# Patient Record
Sex: Male | Born: 1945 | Smoking: Current every day smoker
Health system: Southern US, Community
[De-identification: ages and names within clinical notes are randomized; demographics above are authoritative.]

## PROBLEM LIST (undated history)

## (undated) DIAGNOSIS — I639 Cerebral infarction, unspecified: Secondary | ICD-10-CM

## (undated) DIAGNOSIS — I2510 Atherosclerotic heart disease of native coronary artery without angina pectoris: Secondary | ICD-10-CM

## (undated) DIAGNOSIS — I6930 Unspecified sequelae of cerebral infarction: Secondary | ICD-10-CM

## (undated) DIAGNOSIS — I4891 Unspecified atrial fibrillation: Secondary | ICD-10-CM

## (undated) DIAGNOSIS — I219 Acute myocardial infarction, unspecified: Secondary | ICD-10-CM

## (undated) DIAGNOSIS — H269 Unspecified cataract: Secondary | ICD-10-CM

## (undated) DIAGNOSIS — Z720 Tobacco use: Secondary | ICD-10-CM

## (undated) DIAGNOSIS — I10 Essential (primary) hypertension: Secondary | ICD-10-CM

## (undated) DIAGNOSIS — E785 Hyperlipidemia, unspecified: Secondary | ICD-10-CM

## (undated) HISTORY — DX: Unspecified atrial fibrillation: I48.91

## (undated) HISTORY — DX: Unspecified sequelae of cerebral infarction: I69.30

## (undated) HISTORY — DX: Acute myocardial infarction, unspecified: I21.9

## (undated) HISTORY — PX: SHOULDER ARTHROSCOPY: SHX128

## (undated) HISTORY — DX: Atherosclerotic heart disease of native coronary artery without angina pectoris: I25.10

## (undated) HISTORY — DX: Hyperlipidemia, unspecified: E78.5

## (undated) HISTORY — PX: CATARACT EXTRACTION: SUR2

## (undated) HISTORY — DX: Tobacco use: Z72.0

## (undated) HISTORY — DX: Unspecified cataract: H26.9

## (undated) HISTORY — DX: Cerebral infarction, unspecified: I63.9

## (undated) HISTORY — PX: KNEE ARTHROSCOPY: SUR90

## (undated) HISTORY — PX: TONSILLECTOMY: SUR1361

## (undated) HISTORY — PX: HERNIA REPAIR: SHX51

## (undated) HISTORY — DX: Essential (primary) hypertension: I10

## (undated) HISTORY — PX: CORONARY ANGIOPLASTY WITH STENT PLACEMENT: SHX49

## (undated) HISTORY — PX: CAROTID ENDARTERECTOMY: SUR193

## (undated) HISTORY — PX: CORONARY ARTERY BYPASS GRAFT: SHX141

---

## 1995-11-23 DIAGNOSIS — E7800 Pure hypercholesterolemia, unspecified: Secondary | ICD-10-CM | POA: Insufficient documentation

## 2011-12-17 DIAGNOSIS — I2510 Atherosclerotic heart disease of native coronary artery without angina pectoris: Secondary | ICD-10-CM | POA: Insufficient documentation

## 2011-12-17 DIAGNOSIS — I4891 Unspecified atrial fibrillation: Secondary | ICD-10-CM | POA: Insufficient documentation

## 2012-04-28 DIAGNOSIS — I6930 Unspecified sequelae of cerebral infarction: Secondary | ICD-10-CM | POA: Insufficient documentation

## 2012-04-28 DIAGNOSIS — G609 Hereditary and idiopathic neuropathy, unspecified: Secondary | ICD-10-CM | POA: Insufficient documentation

## 2012-04-28 DIAGNOSIS — Z720 Tobacco use: Secondary | ICD-10-CM

## 2012-04-28 DIAGNOSIS — M1990 Unspecified osteoarthritis, unspecified site: Secondary | ICD-10-CM | POA: Insufficient documentation

## 2012-04-28 DIAGNOSIS — I6990 Unspecified sequelae of unspecified cerebrovascular disease: Secondary | ICD-10-CM | POA: Insufficient documentation

## 2012-04-28 HISTORY — DX: Tobacco use: Z72.0

## 2012-10-14 DIAGNOSIS — I10 Essential (primary) hypertension: Secondary | ICD-10-CM | POA: Insufficient documentation

## 2013-09-07 ENCOUNTER — Ambulatory Visit: Payer: Self-pay | Admitting: Physician Assistant

## 2013-09-08 ENCOUNTER — Ambulatory Visit: Payer: Self-pay | Admitting: Physician Assistant

## 2013-10-12 DIAGNOSIS — M25579 Pain in unspecified ankle and joints of unspecified foot: Secondary | ICD-10-CM | POA: Insufficient documentation

## 2013-11-22 DIAGNOSIS — I509 Heart failure, unspecified: Secondary | ICD-10-CM | POA: Insufficient documentation

## 2014-07-05 DIAGNOSIS — R29898 Other symptoms and signs involving the musculoskeletal system: Secondary | ICD-10-CM | POA: Insufficient documentation

## 2014-07-05 DIAGNOSIS — G589 Mononeuropathy, unspecified: Secondary | ICD-10-CM | POA: Insufficient documentation

## 2015-03-15 DIAGNOSIS — Z8673 Personal history of transient ischemic attack (TIA), and cerebral infarction without residual deficits: Secondary | ICD-10-CM | POA: Insufficient documentation

## 2015-04-09 ENCOUNTER — Ambulatory Visit: Payer: Medicare HMO | Attending: Pain Medicine | Admitting: Pain Medicine

## 2015-04-09 ENCOUNTER — Other Ambulatory Visit: Payer: Self-pay | Admitting: Pain Medicine

## 2015-04-09 ENCOUNTER — Encounter: Payer: Self-pay | Admitting: Pain Medicine

## 2015-04-09 VITALS — BP 132/77 | HR 64 | Temp 98.5°F | Resp 18 | Ht 72.0 in | Wt 232.0 lb

## 2015-04-09 DIAGNOSIS — I10 Essential (primary) hypertension: Secondary | ICD-10-CM | POA: Diagnosis not present

## 2015-04-09 DIAGNOSIS — Z9181 History of falling: Secondary | ICD-10-CM

## 2015-04-09 DIAGNOSIS — F1190 Opioid use, unspecified, uncomplicated: Secondary | ICD-10-CM | POA: Diagnosis not present

## 2015-04-09 DIAGNOSIS — Z8673 Personal history of transient ischemic attack (TIA), and cerebral infarction without residual deficits: Secondary | ICD-10-CM | POA: Insufficient documentation

## 2015-04-09 DIAGNOSIS — F17210 Nicotine dependence, cigarettes, uncomplicated: Secondary | ICD-10-CM | POA: Diagnosis not present

## 2015-04-09 DIAGNOSIS — Z79899 Other long term (current) drug therapy: Secondary | ICD-10-CM | POA: Diagnosis not present

## 2015-04-09 DIAGNOSIS — I4891 Unspecified atrial fibrillation: Secondary | ICD-10-CM | POA: Insufficient documentation

## 2015-04-09 DIAGNOSIS — M79604 Pain in right leg: Secondary | ICD-10-CM | POA: Insufficient documentation

## 2015-04-09 DIAGNOSIS — G8929 Other chronic pain: Secondary | ICD-10-CM

## 2015-04-09 DIAGNOSIS — I69898 Other sequelae of other cerebrovascular disease: Secondary | ICD-10-CM

## 2015-04-09 DIAGNOSIS — M545 Low back pain, unspecified: Secondary | ICD-10-CM

## 2015-04-09 DIAGNOSIS — M79603 Pain in arm, unspecified: Secondary | ICD-10-CM | POA: Diagnosis present

## 2015-04-09 DIAGNOSIS — M25562 Pain in left knee: Secondary | ICD-10-CM

## 2015-04-09 DIAGNOSIS — M5416 Radiculopathy, lumbar region: Secondary | ICD-10-CM

## 2015-04-09 DIAGNOSIS — F1120 Opioid dependence, uncomplicated: Secondary | ICD-10-CM

## 2015-04-09 DIAGNOSIS — Z79891 Long term (current) use of opiate analgesic: Secondary | ICD-10-CM | POA: Insufficient documentation

## 2015-04-09 DIAGNOSIS — M7501 Adhesive capsulitis of right shoulder: Secondary | ICD-10-CM

## 2015-04-09 DIAGNOSIS — M25569 Pain in unspecified knee: Secondary | ICD-10-CM | POA: Insufficient documentation

## 2015-04-09 DIAGNOSIS — M7500 Adhesive capsulitis of unspecified shoulder: Secondary | ICD-10-CM | POA: Insufficient documentation

## 2015-04-09 DIAGNOSIS — Z7189 Other specified counseling: Secondary | ICD-10-CM

## 2015-04-09 DIAGNOSIS — IMO0002 Reserved for concepts with insufficient information to code with codable children: Secondary | ICD-10-CM

## 2015-04-09 DIAGNOSIS — E7800 Pure hypercholesterolemia, unspecified: Secondary | ICD-10-CM | POA: Insufficient documentation

## 2015-04-09 DIAGNOSIS — M21371 Foot drop, right foot: Secondary | ICD-10-CM

## 2015-04-09 DIAGNOSIS — M79673 Pain in unspecified foot: Secondary | ICD-10-CM | POA: Diagnosis present

## 2015-04-09 DIAGNOSIS — M1710 Unilateral primary osteoarthritis, unspecified knee: Secondary | ICD-10-CM

## 2015-04-09 DIAGNOSIS — M792 Neuralgia and neuritis, unspecified: Secondary | ICD-10-CM

## 2015-04-09 DIAGNOSIS — Z5181 Encounter for therapeutic drug level monitoring: Secondary | ICD-10-CM

## 2015-04-09 DIAGNOSIS — M25561 Pain in right knee: Secondary | ICD-10-CM

## 2015-04-09 DIAGNOSIS — F1990 Other psychoactive substance use, unspecified, uncomplicated: Secondary | ICD-10-CM

## 2015-04-09 DIAGNOSIS — R296 Repeated falls: Secondary | ICD-10-CM

## 2015-04-09 DIAGNOSIS — M179 Osteoarthritis of knee, unspecified: Secondary | ICD-10-CM

## 2015-04-09 DIAGNOSIS — M170 Bilateral primary osteoarthritis of knee: Secondary | ICD-10-CM

## 2015-04-09 DIAGNOSIS — M2550 Pain in unspecified joint: Secondary | ICD-10-CM

## 2015-04-09 DIAGNOSIS — I6930 Unspecified sequelae of cerebral infarction: Secondary | ICD-10-CM

## 2015-04-09 HISTORY — DX: Unspecified sequelae of cerebral infarction: I69.30

## 2015-04-09 MED ORDER — OXYCODONE HCL 5 MG PO TABS: 5.0000 mg | ORAL_TABLET | Freq: Every day | ORAL | Status: DC | PRN

## 2015-04-09 MED ORDER — OXYCODONE HCL 5 MG PO CAPS: 5.0000 mg | ORAL_CAPSULE | Freq: Every day | ORAL | Status: DC | PRN

## 2015-04-09 NOTE — Progress Notes (Signed)
Patient's Name: Terrence Miller MRN: [id number] DOB: 10/12/1945 DOS: 04/09/2015  Primary Reason(s) for Visit: Encounter for Medication Management CC: Foot Pain and Arm Pain   HPI:   Terrence Miller is a 69 y.o. year old, male patient, who returns today as an established patient. He has Chronic pain; Long term current use of opiate analgesic; Long term prescription opiate use; Opiate use; Encounter for therapeutic drug level monitoring; Encounter for chronic pain management; Opioid dependence (HCC); Weakness of foot; Current tobacco use; Ankle pain; Arthritis, degenerative; Mononeuropathy; Cerebrovascular accident, late effects; BP (high blood pressure); Hypercholesterolemia; Cerebrovascular accident, old; Hereditary and idiopathic neuropathy; Arteriosclerosis of coronary artery; Congestive heart failure (HCC); Atrial fibrillation (HCC); Chronic low back pain; Chronic lower extremity pain (Right); Chronic lumbar radicular pain (Right); Osteoarthritis of the knees (Bilateral); Chronic knee pain (Bilateral) (L>R); Foot drop (Right-sided) ; Frozen shoulder (Right side); Neurological deficit due to ischemic stroke (Right-sided body weakness) (last one in 2015); Neuropathic pain; Neurogenic pain; Arthritic-like pain; Substance use disorder Risk: High; At high risk for falls; Personal history of stroke with residual effects (multiple strokes) (Right-sided body weakness); and Tricompartmental disease of knee (Bilateral) on his problem list.. His primarily concern today is the Foot Pain and Arm Pain     Two (2) weeks ago the patient had another stroke with right-sided weakness. He is currently undergoing physical therapy 2 see if he can recall over some of the strength in his hand and right leg. He complains of a burning sensation over the top of the foot in what seems to be an L5 dermatomal distribution. He is currently taking gabapentin and he is slowly being increased. I agree with this choice of medication which I  believe is the best for this neuropathic pain. He comes in today in a wheelchair.  Today's Pain Score: 6 , clinically he looks like a 2/10. Reported level of pain is incompatible with clinical obrservations. This may be secondary to a possible lack of understanding on how the pain scale works. Pain Type: Chronic pain Pain Location: Foot Pain Orientation: Right Pain Descriptors / Indicators: Constant, Stabbing, Aching Pain Frequency: Constant    Last visit: 01/02/2015   Reason for last evaluation: Pharmacological management.  Pharmacotherapy Review:   Side-effects or Adverse reactions: None reported. Effectiveness: Described as relatively effective, allowing for increase in activities of daily living (ADL). Onset of action: Within expected pharmacological parameters. Duration of action: Within normal limits for medication. Peak effect: Timing and results are as within normal expected parameters. [place] PMP: Compliant with practice rules and regulations. Medication Assessment Form: Reviewed. Patient indicates being compliant with therapy Treatment compliance: Compliant. Substance Use Disorder (SUD) Risk Level: High Pharmacologic Plan: Continue therapy as is. Although the patient is high risk, we have decided to give him medication refills for the next 3 months in order for him to concentrate on his physical therapy. After that, and depending on the results of his UDS, we will decide if we need to go back to shorter intervals between visits.  Allergies: Terrence Miller is allergic to amitriptyline.  Meds: The patient has a current medication list which includes the following prescription(s): atorvastatin, clopidogrel, gabapentin, losartan, metoprolol succinate, oxycodone, spironolactone, oxycodone, and oxycodone. Requested Prescriptions   Signed Prescriptions Disp Refills  . oxyCODONE (OXY IR/ROXICODONE) 5 MG immediate release tablet 150 tablet 0    Sig: Take 1 tablet (5 mg total) by mouth 5  (five) times daily as needed.  Marland Kitchen oxycodone (OXY-IR) 5 MG capsule 150 capsule 0  Sig: Take 1 capsule (5 mg total) by mouth 5 (five) times daily as needed for pain.  . oxycodone (OXY-IR) 5Marland Kitchen MG capsule 150 capsule 0    Sig: Take 1 capsule (5 mg total) by mouth 5 (five) times daily as needed for pain.    ROS: Constitutional: Afebrile, no chills, well hydrated and well nourished Gastrointestinal: negative Musculoskeletal:negative Neurological: negative Behavioral/Psych: negative  PFSH: Medical:  Terrence Miller  has a past medical history of Stroke [facility]); Atrial fibrillation (HCC); Cataract; Hypertension; Hyperlipidemia; CAD (coronary artery disease); Myocardial infarction (HCC); Current tobacco use (04/28/2012); and Personal history of stroke with residual effects (multiple strokes) (04/09/2015). Family: family history includes Heart disease in his father and mother. Surgical:  has past surgical history that includes Hernia repair; Cataract extraction (Bilateral); Coronary artery bypass graft; Shoulder arthroscopy (Right); Knee arthroscopy (Bilateral); Carotid endarterectomy (Left); Tonsillectomy; and Coronary angioplasty with stent. Tobacco:  reports that he has been smoking Cigarettes.  He has been smoking about 0.25 packs per day. He has never used smokeless tobacco. Alcohol:  reports that he does not drink alcohol. Drug:  reports that he does not use illicit drugs.  Physical Exam: Vitals:  Today's Vitals   04/09/15 1019  BP: 132/77  Pulse: 64  Temp: 98.5 F (36.9 C)  TempSrc: Oral  Resp: 18  Height: 6' (1.829 m)  Weight: 232 lb (105.235 kg)  SpO2: 98%  PainSc: 6   PainLoc: Foot  Calculated BMI: Body mass index is 31.46 kg/(m^2). General appearance: alert, cooperative, appears stated age, no distress and mildly obese Eyes: conjunctivae/corneas clear. PERRL, EOM's intact. Fundi benign. Lungs: No evidence respiratory distress, no audible rales or ronchi and no use of accessory  muscles of respiration Neck: no adenopathy, no carotid bruit, no JVD, supple, symmetrical, trachea midline and thyroid not enlarged, symmetric, no tenderness/mass/nodules Back: symmetric, no curvature. ROM normal. No CVA tenderness. Extremities: He comes into the clinic today in a wheelchair due to his most recent stroke and right-sided weakness. Pulses: 2+ and symmetric Skin: Skin color, texture, turgor normal. No rashes or lesions Neurologic: Right-sided body weakness secondary to the stroke. This includes a right foot drop.    Assessment: Encounter Diagnosis:  Primary Diagnosis: Chronic pain [G89.29]  Plan: Ruvim was seen today for foot pain and arm pain.  Diagnoses and all orders for this visit:  Chronic pain -     COMPLETE METABOLIC PANEL WITH GFR; Future -     C-reactive protein; Future -     Magnesium; Future -     Sedimentation rate; Future -     Vitamin D2,D3 Panel; Future -     oxyCODONE (OXY IR/ROXICODONE) 5 MG immediate release tablet; Take 1 tablet (5 mg total) by mouth 5 (five) times daily as needed. -     oxycodone (OXY-IR) 5 MG capsule; Take 1 capsule (5 mg total) by mouth 5 (five) times daily as needed for pain. -     oxycodone (OXY-IR) 5 MG capsule; Take 1 capsule (5 mg total) by mouth 5 (five) times daily as needed for pain.  Long term current use of opiate analgesic -     Drugs of abuse screen w/o alc, rtn urine-sln; Future -     Drugs of abuse screen w/o alc, rtn urine-sln  Long term prescription opiate use  Opiate use  Encounter for therapeutic drug level monitoring  Encounter for chronic pain management  Uncomplicated opioid dependence (HCC)  Chronic low back pain  Chronic right lower extremity pain  Chronic lumbar radicular pain (Right)  Primary osteoarthritis of both knees  Chronic knee pain (Bilateral)  Foot drop (Right-sided)   Frozen shoulder, right  Neurological deficit due to ischemic stroke (Right-sided body weakness) (last one in  2015)  Neuropathic pain  Neurogenic pain  Arthritic-like pain  Substance use disorder Risk: High  At high risk for falls  Personal history of stroke with residual effects (multiple strokes) (Right-sided body weakness)  Tricompartmental disease of knee (Bilateral)     Patient Instructions  Labwork to be drawn as discussed.   Medications discontinued today:  Medications Discontinued During This Encounter  Medication Reason  . oxyCODONE (OXY IR/ROXICODONE) 5 MG immediate release tablet Reorder   Medications administered today:  [name] had no medications administered during this visit.  Primary Care Physician: No primary care provider on file. Location: [facility] Note by: Valyncia Wiens A. Laban EmperorNaveira, M.D, DABA, DABAPM, DABPM, DABIPP, FIPP

## 2015-04-09 NOTE — Progress Notes (Signed)
Safety precautions to be maintained throughout the outpatient stay will include: orient to surroundings, keep bed in low position, maintain call bell within reach at all times, provide assistance with transfer out of bed and ambulation.  Oxycodone count 0 

## 2015-04-09 NOTE — Patient Instructions (Signed)
Labwork to be drawn as discussed 

## 2015-04-14 LAB — TOXASSURE SELECT 13 (MW), URINE: PDF: 0

## 2015-05-01 DIAGNOSIS — I63541 Cerebral infarction due to unspecified occlusion or stenosis of right cerebellar artery: Secondary | ICD-10-CM | POA: Insufficient documentation

## 2015-05-15 DIAGNOSIS — G8191 Hemiplegia, unspecified affecting right dominant side: Secondary | ICD-10-CM | POA: Insufficient documentation

## 2015-05-23 DIAGNOSIS — I69359 Hemiplegia and hemiparesis following cerebral infarction affecting unspecified side: Secondary | ICD-10-CM | POA: Insufficient documentation

## 2015-07-09 ENCOUNTER — Encounter: Payer: Self-pay | Admitting: Pain Medicine

## 2015-07-09 ENCOUNTER — Ambulatory Visit: Payer: Medicare HMO | Attending: Pain Medicine | Admitting: Pain Medicine

## 2015-07-09 VITALS — BP 153/78 | HR 67 | Temp 97.9°F | Resp 18 | Ht 72.0 in | Wt 239.0 lb

## 2015-07-09 DIAGNOSIS — I4891 Unspecified atrial fibrillation: Secondary | ICD-10-CM | POA: Diagnosis not present

## 2015-07-09 DIAGNOSIS — M818 Other osteoporosis without current pathological fracture: Secondary | ICD-10-CM

## 2015-07-09 DIAGNOSIS — Z9889 Other specified postprocedural states: Secondary | ICD-10-CM | POA: Insufficient documentation

## 2015-07-09 DIAGNOSIS — M79603 Pain in arm, unspecified: Secondary | ICD-10-CM | POA: Diagnosis present

## 2015-07-09 DIAGNOSIS — G8929 Other chronic pain: Secondary | ICD-10-CM | POA: Diagnosis not present

## 2015-07-09 DIAGNOSIS — Z951 Presence of aortocoronary bypass graft: Secondary | ICD-10-CM | POA: Insufficient documentation

## 2015-07-09 DIAGNOSIS — M25561 Pain in right knee: Secondary | ICD-10-CM | POA: Insufficient documentation

## 2015-07-09 DIAGNOSIS — E785 Hyperlipidemia, unspecified: Secondary | ICD-10-CM | POA: Insufficient documentation

## 2015-07-09 DIAGNOSIS — E7800 Pure hypercholesterolemia, unspecified: Secondary | ICD-10-CM | POA: Diagnosis not present

## 2015-07-09 DIAGNOSIS — Z5181 Encounter for therapeutic drug level monitoring: Secondary | ICD-10-CM | POA: Diagnosis not present

## 2015-07-09 DIAGNOSIS — I252 Old myocardial infarction: Secondary | ICD-10-CM | POA: Insufficient documentation

## 2015-07-09 DIAGNOSIS — I69359 Hemiplegia and hemiparesis following cerebral infarction affecting unspecified side: Secondary | ICD-10-CM | POA: Diagnosis not present

## 2015-07-09 DIAGNOSIS — Z79891 Long term (current) use of opiate analgesic: Secondary | ICD-10-CM | POA: Insufficient documentation

## 2015-07-09 DIAGNOSIS — F1190 Opioid use, unspecified, uncomplicated: Secondary | ICD-10-CM

## 2015-07-09 DIAGNOSIS — F17210 Nicotine dependence, cigarettes, uncomplicated: Secondary | ICD-10-CM | POA: Insufficient documentation

## 2015-07-09 DIAGNOSIS — M810 Age-related osteoporosis without current pathological fracture: Secondary | ICD-10-CM | POA: Diagnosis not present

## 2015-07-09 DIAGNOSIS — M7120 Synovial cyst of popliteal space [Baker], unspecified knee: Secondary | ICD-10-CM | POA: Insufficient documentation

## 2015-07-09 DIAGNOSIS — I509 Heart failure, unspecified: Secondary | ICD-10-CM | POA: Insufficient documentation

## 2015-07-09 DIAGNOSIS — M792 Neuralgia and neuritis, unspecified: Secondary | ICD-10-CM

## 2015-07-09 DIAGNOSIS — M25562 Pain in left knee: Secondary | ICD-10-CM

## 2015-07-09 DIAGNOSIS — I10 Essential (primary) hypertension: Secondary | ICD-10-CM | POA: Insufficient documentation

## 2015-07-09 DIAGNOSIS — M7121 Synovial cyst of popliteal space [Baker], right knee: Secondary | ICD-10-CM | POA: Diagnosis not present

## 2015-07-09 DIAGNOSIS — M79606 Pain in leg, unspecified: Secondary | ICD-10-CM | POA: Diagnosis present

## 2015-07-09 LAB — C-REACTIVE PROTEIN

## 2015-07-09 LAB — SEDIMENTATION RATE: Sed Rate: 13 mm/hr (ref 0–20)

## 2015-07-09 LAB — COMPREHENSIVE METABOLIC PANEL
ALT: 13 U/L — ABNORMAL LOW (ref 17–63)
ANION GAP: 8 (ref 5–15)
AST: 20 U/L (ref 15–41)
Albumin: 4.3 g/dL (ref 3.5–5.0)
Alkaline Phosphatase: 101 U/L (ref 38–126)
BILIRUBIN TOTAL: 0.8 mg/dL (ref 0.3–1.2)
BUN: 19 mg/dL (ref 6–20)
CHLORIDE: 108 mmol/L (ref 101–111)
CO2: 25 mmol/L (ref 22–32)
Calcium: 9.7 mg/dL (ref 8.9–10.3)
Creatinine, Ser: 1.17 mg/dL (ref 0.61–1.24)
Glucose, Bld: 106 mg/dL — ABNORMAL HIGH (ref 65–99)
POTASSIUM: 5.1 mmol/L (ref 3.5–5.1)
Sodium: 141 mmol/L (ref 135–145)
TOTAL PROTEIN: 8 g/dL (ref 6.5–8.1)

## 2015-07-09 LAB — MAGNESIUM: MAGNESIUM: 2.1 mg/dL (ref 1.7–2.4)

## 2015-07-09 MED ORDER — OXYCODONE HCL 5 MG PO TABS: 5.0000 mg | ORAL_TABLET | Freq: Every day | ORAL | Status: DC | PRN

## 2015-07-09 MED ORDER — GABAPENTIN 800 MG PO TABS: 1200.0000 mg | ORAL_TABLET | Freq: Four times a day (QID) | ORAL | Status: DC

## 2015-07-09 NOTE — Progress Notes (Signed)
Patient's Name: Terrence Miller MRN: [id number] DOB: 02-Mar-1946 DOS: 07/09/2015  Primary Reason(s) for Visit: Encounter for Medication Management CC: Leg Pain and Arm Pain   HPI  Terrence Miller is a 70 y.o. year old, male patient, who returns today as an established patient. He has Chronic pain; Long term current use of opiate analgesic; Long term prescription opiate use; Opiate use (37.5 MME/day); Encounter for therapeutic drug level monitoring; Encounter for chronic pain management; Opioid dependence (HCC); Weakness of foot; Current tobacco use; Ankle pain; Arthritis, degenerative; Mononeuropathy; Cerebrovascular accident, late effects; BP (high blood pressure); Hypercholesterolemia; Cerebrovascular accident, old; Hereditary and idiopathic neuropathy; Arteriosclerosis of coronary artery; Congestive heart failure (HCC); Atrial fibrillation (HCC); Chronic low back pain; Chronic lower extremity pain (Right); Chronic lumbar radicular pain (Right); Osteoarthritis of the knees (Bilateral); Chronic knee pain (Bilateral) (L>R); Foot drop (Right-sided) ; Frozen shoulder (Right side); Neurological deficit due to ischemic stroke (Right-sided body weakness) (last one in 2015); Neuropathic pain; Neurogenic pain; Arthritic-like pain; Substance use disorder Risk: High; At high risk for falls; Personal history of stroke with residual effects (multiple strokes) (Right-sided body weakness); Tricompartmental disease of knee (Bilateral); Osteoporosis, idiopathic; Hemiparesis, right (HCC); 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 His primarily concern today is the Leg Pain and Arm Pain   The patient comes in today clinics today for pharmacological management of his chronic pain. He apparently had another stroke with right arm weakness and right lower extremity  weakness and pain. This is his seventh stroke. Today he is requesting for me to go back to the OxyContin. However, last time we switch from the OxyContin to the oxycodone because the insurance co-pay was not paying for it. He later requested that we than increase his oxycodone to 10 mg. However, in reviewing how he is doing with his current dosing where he is getting 70 09/29/1978 percent relief with the medication, I see no point in increase in it. Today I talked to him about medication tolerance and how to deal with it using "Drug Holidays". I also reviewed with him the goals of chronic pain management which are functionality rather than 100% relief of the pain. He understood and agreed. In addition, he indicates that his primary pain is that of the right knee which is secondary to a Baker's cyst. He has been getting intra-articular knee injections with viscosupplementation therapy. There is injections have been done by Dr. Margaretmary Bayley in [place] (orthopedic surgeon).  His second source of pain is the right arm which started hurting after he had his seventh stroke. This pain is neuropathic and therefore it would respond poorly to the use of opioids. To assist him with this kind of pain I will increase his gabapentin to 800 mg 4 times a day.  Pain Assessment: Self-Reported Pain Score: 7 , clinically he looks like a 2-3/10. Reported level is inconsistent with clinical obrservations Pain Type: Chronic pain Pain Location: Leg (arm) Pain Orientation: Right Pain Descriptors / Indicators: Aching Pain Frequency: Constant  Date of Last Visit: 04/09/15 Service Provided on Last Visit: Med Refill  Controlled Substance Pharmacotherapy Assessment  Analgesic: Oxycodone IR 5 mg 5 times a day (25 mg/day) MME/day: 37.5 mg/day Pharmacokinetics: Onset of action (Liberation/Absorption): Within expected pharmacological parameters. (One hour) Time to Peak effect (Distribution): Timing and results are as within normal  expected parameters. (2.5-3 hours) Duration of action (Metabolism/Excretion): Within  normal limits for medication. (5-6 hours) Pharmacodynamics: Analgesic Effect: 75-80% Activity Facilitation: Medication(s) allow patient to sit, stand, walk, and do the basic ADLs Perceived Effectiveness: Described as relatively effective, allowing for increase in activities of daily living (ADL) Side-effects or Adverse reactions: None reported Monitoring: [place] PMP: Compliant with practice rules and regulations UDS Results/interpretation: Last UDS done on 04/09/2015 and it came back within normal limits with no unexpected results. Medication Assessment Form: Reviewed. Patient indicates being compliant with therapy Treatment compliance: Compliant Risk Assessment: Substance Use Disorder (SUD) Risk Level: Low Opioid Risk Tool (ORT) Score: Total Score: 6 Moderate Risk for SUD (Score between 4-7) Depression Scale Score:    Pharmacologic Plan: Continue therapy as is  Lab Work: Illicit Drugs No results found for: THCU, COCAINSCRNUR, PCPSCRNUR, MDMA, AMPHETMU, METHADONE, ETOH  Inflammation Markers Lab Results  Component Value Date   ESRSEDRATE 13 07/09/2015    Renal Function Lab Results  Component Value Date   BUN 19 07/09/2015   CREATININE 1.17 07/09/2015   GFRAA >60 07/09/2015   GFRNONAA >60 07/09/2015    Hepatic Function Lab Results  Component Value Date   AST 20 07/09/2015   ALT 13* 07/09/2015   ALBUMIN 4.3 07/09/2015    Electrolytes Lab Results  Component Value Date   NA 141 07/09/2015   K 5.1 07/09/2015   CL 108 07/09/2015   CALCIUM 9.7 07/09/2015   MG 2.1 07/09/2015    Allergies  Terrence Miller is allergic to amitriptyline.  Meds  The patient has a current medication list which includes the following prescription(s): allopurinol, aspirin, atorvastatin, clopidogrel, furosemide, gabapentin, losartan, metoprolol succinate, oxycodone, spironolactone, oxycodone, and  oxycodone.  Current Outpatient Prescriptions on File Prior to Visit  Medication Sig  . atorvastatin (LIPITOR) 80 MG tablet Take 80 mg by mouth daily.  . clopidogrel (PLAVIX) 75 MG tablet Take 75 mg by mouth daily.  Marland Kitchen losartan (COZAAR) 100 MG tablet Take 100 mg by mouth daily.  . metoprolol succinate (TOPROL-XL) 25 MG 24 hr tablet Take 25 mg by mouth daily. Take 1/2 tablet  . spironolactone (ALDACTONE) 25 MG tablet Take 25 mg by mouth daily.   No current facility-administered medications on file prior to visit.    ROS  Constitutional: Afebrile, no chills, well hydrated and well nourished Gastrointestinal: negative Musculoskeletal:negative Neurological: negative Behavioral/Psych: negative  PFSH  Medical:  Terrence Miller  has a past medical history of Stroke [facility]); Atrial fibrillation (HCC); Cataract; Hypertension; Hyperlipidemia; CAD (coronary artery disease); Myocardial infarction (HCC); Current tobacco use (04/28/2012); and Personal history of stroke with residual effects (multiple strokes) (04/09/2015). Family: family history includes Heart disease in his father and mother. Surgical:  has past surgical history that includes Hernia repair; Cataract extraction (Bilateral); Coronary artery bypass graft; Shoulder arthroscopy (Right); Knee arthroscopy (Bilateral); Carotid endarterectomy (Left); Tonsillectomy; and Coronary angioplasty with stent. Tobacco:  reports that he has been smoking Cigarettes.  He has been smoking about 0.25 packs per day. He has never used smokeless tobacco. Alcohol:  reports that he does not drink alcohol. Drug:  reports that he does not use illicit drugs.  Physical Exam  Vitals:  Today's Vitals   07/09/15 1029 07/09/15 1031  BP:  153/78  Pulse: 67   Temp: 97.9 F (36.6 C)   Resp: 18   Height: 6' (1.829 m)   Weight: 239 lb (108.41 kg)   SpO2: 98%   PainSc: 7  7   PainLoc: Leg     Calculated BMI: Body mass index is 32.41  kg/(m^2).  General appearance:  alert, cooperative, appears stated age and no distress Eyes: PERLA Respiratory: No evidence respiratory distress, no audible rales or ronchi and no use of accessory muscles of respiration  Cervical Spine Inspection: Normal anatomy Alignment: Symetrical ROM: Adequate  Upper Extremities Inspection: No gross anomalies detected ROM: Decreased for right upper extremity Sensory: Normal Motor: Weakness on the right upper extremity  Thoracic Spine Inspection: No gross anomalies detected Alignment: Symetrical ROM: Adequate  Lumbar Spine Inspection: No gross anomalies detected Alignment: Symetrical ROM: Decreased  Gait: Antalgic (limping)  Lower Extremities Inspection: No gross anomalies detected ROM: Decreased for right knee where he has a brace. Sensory:  Normal Motor: Unremarkable  Assessment & Plan  Primary Diagnosis & Pertinent Problem List: The primary encounter diagnosis was Chronic pain. Diagnoses of Chronic knee pain (Bilateral) (L>R), Encounter for therapeutic drug level monitoring, Long term current use of opiate analgesic, Osteoporosis, idiopathic, Neurogenic pain, Neuropathic pain, Opiate use (37.5 MME/day), and Baker's cyst of knee, right were also pertinent to this visit.  Visit Diagnosis: 1. Chronic pain   2. Chronic knee pain (Bilateral) (L>R)   3. Encounter for therapeutic drug level monitoring   4. Long term current use of opiate analgesic   5. Osteoporosis, idiopathic   6. Neurogenic pain   7. Neuropathic pain   8. Opiate use (37.5 MME/day)   9. Baker's cyst of knee, right     Problem-specific Plan(s): No problem-specific assessment & plan notes found for this encounter.   Plan of Care  Pharmacotherapy (Medications Ordered): Meds ordered this encounter  Medications  . oxyCODONE (OXY IR/ROXICODONE) 5 MG immediate release tablet    Sig: Take 1 tablet (5 mg total) by mouth 5 (five) times daily as needed for moderate pain or severe pain.    Dispense:   150 tablet    Refill:  0    Do not place this medication, or any other prescription from our practice, on "Automatic Refill". Patient may have prescription filled one day early if pharmacy is closed on scheduled refill date. Do not fill until: 07/09/15 To last until: 08/08/15  . oxyCODONE (OXY IR/ROXICODONE) 5 MG immediate release tablet    Sig: Take 1 tablet (5 mg total) by mouth 5 (five) times daily as needed for moderate pain or severe pain.    Dispense:  150 tablet    Refill:  0    Do not place this medication, or any other prescription from our practice, on "Automatic Refill". Patient may have prescription filled one day early if pharmacy is closed on scheduled refill date. Do not fill until: 08/08/15 To last until: 09/07/15  . oxyCODONE (OXY IR/ROXICODONE) 5 MG immediate release tablet    Sig: Take 1 tablet (5 mg total) by mouth 5 (five) times daily as needed for moderate pain or severe pain.    Dispense:  150 tablet    Refill:  0    Do not place this medication, or any other prescription from our practice, on "Automatic Refill". Patient may have prescription filled one day early if pharmacy is closed on scheduled refill date. Do not fill until: 09/07/15 To last until: 10/07/15  . gabapentin (NEURONTIN) 800 MG tablet    Sig: Take 1.5 tablets (1,200 mg total) by mouth every 6 (six) hours.    Dispense:  120 tablet    Refill:  2    Do not place this medication, or any other prescription from our practice, on "Automatic Refill". Patient may have prescription  filled one day early if pharmacy is closed on scheduled refill date.    Lab-work & Procedure Ordered: Orders Placed This Encounter  Procedures  . ToxASSURE Select 13 (MW), Urine    Volume: 30 ml(s). Minimum 3 ml of urine is needed. Document temperature of fresh sample. Indications: Long term (current) use of opiate analgesic (Z79.891)  . Comprehensive metabolic panel    Order Specific Question:  Has the patient fasted?     Answer:  No  . C-reactive protein  . Magnesium  . Sedimentation rate  . Vitamin B12    Indication: Bone Pain (M89.9)  . Vitamin D pnl(25-hydrxy+1,25-dihy)-bld    Imaging Ordered: None  Interventional Therapies: Scheduled: None at this time. PRN Procedures: None at this time.    Referral(s) or Consult(s): None at this time.  Medications administered during this visit: Terrence Miller had no medications administered during this visit.  Future Appointments Date Time Provider Department Center  10/03/2015 10:00 AM Delano Metz, MD [facility] None    Primary Care Physician: No primary care provider on file. Location: [facility] Note by: Amoria Mclees A. Laban Emperor, M.D, DABA, DABAPM, DABPM, DABIPP, FIPP  Pain Score Disclaimer: We use the NRS-11 scale. This is a self-reported, subjective measurement of pain severity with only modest accuracy. It is used primarily to identify changes within a particular patient. It must be understood that outpatient pain scales are significantly less accurate that those used for research, where they can be applied under ideal controlled circumstances with minimal exposure to variables. In reality, the score is likely to be a combination of pain intensity and pain affect, where pain affect describes the degree of emotional arousal or changes in action readiness caused by the sensory experience of pain. Factors such as social and work situation, setting, emotional state, anxiety levels, expectation, and prior pain experience may influence pain perception and show large inter-individual differences that may also be affected by time variables.

## 2015-07-09 NOTE — Progress Notes (Signed)
Safety precautions to be maintained throughout the outpatient stay will include: orient to surroundings, keep bed in low position, maintain call bell within reach at all times, provide assistance with transfer out of bed and ambulation. Oxycodone pill count # 0/150  Filled 06-08-2015

## 2015-07-09 NOTE — Patient Instructions (Addendum)
Smoking Cessation, Tips for Success If you are ready to quit smoking, congratulations! You have chosen to help yourself be healthier. Cigarettes bring nicotine, tar, carbon monoxide, and other irritants into your body. Your lungs, heart, and blood vessels will be able to work better without these poisons. There are many different ways to quit smoking. Nicotine gum, nicotine patches, a nicotine inhaler, or nicotine nasal spray can help with physical craving. Hypnosis, support groups, and medicines help break the habit of smoking. WHAT THINGS CAN I DO TO MAKE QUITTING EASIER?  Here are some tips to help you quit for good:  Pick a date when you will quit smoking completely. Tell all of your friends and family about your plan to quit on that date.  Do not try to slowly cut down on the number of cigarettes you are smoking. Pick a quit date and quit smoking completely starting on that day.  Throw away all cigarettes.   Clean and remove all ashtrays from your home, work, and car.  On a card, write down your reasons for quitting. Carry the card with you and read it when you get the urge to smoke.  Cleanse your body of nicotine. Drink enough water and fluids to keep your urine clear or pale yellow. Do this after quitting to flush the nicotine from your body.  Learn to predict your moods. Do not let a bad situation be your excuse to have a cigarette. Some situations in your life might tempt you into wanting a cigarette.  Never have "just one" cigarette. It leads to wanting another and another. Remind yourself of your decision to quit.  Change habits associated with smoking. If you smoked while driving or when feeling stressed, try other activities to replace smoking. Stand up when drinking your coffee. Brush your teeth after eating. Sit in a different chair when you read the paper. Avoid alcohol while trying to quit, and try to drink fewer caffeinated beverages. Alcohol and caffeine may urge you to  smoke.  Avoid foods and drinks that can trigger a desire to smoke, such as sugary or spicy foods and alcohol.  Ask people who smoke not to smoke around you.  Have something planned to do right after eating or having a cup of coffee. For example, plan to take a walk or exercise.  Try a relaxation exercise to calm you down and decrease your stress. Remember, you may be tense and nervous for the first 2 weeks after you quit, but this will pass.  Find new activities to keep your hands busy. Play with a pen, coin, or rubber band. Doodle or draw things on paper.  Brush your teeth right after eating. This will help cut down on the craving for the taste of tobacco after meals. You can also try mouthwash.   Use oral substitutes in place of cigarettes. Try using lemon drops, carrots, cinnamon sticks, or chewing gum. Keep them handy so they are available when you have the urge to smoke.  When you have the urge to smoke, try deep breathing.  Designate your home as a nonsmoking area.  If you are a heavy smoker, ask your health care provider about a prescription for nicotine chewing gum. It can ease your withdrawal from nicotine.  Reward yourself. Set aside the cigarette money you save and buy yourself something nice.  Look for support from others. Join a support group or smoking cessation program. Ask someone at home or at work to help you with your plan   to quit smoking.  Always ask yourself, "Do I need this cigarette or is this just a reflex?" Tell yourself, "Today, I choose not to smoke," or "I do not want to smoke." You are reminding yourself of your decision to quit.  Do not replace cigarette smoking with electronic cigarettes (commonly called e-cigarettes). The safety of e-cigarettes is unknown, and some may contain harmful chemicals.  If you relapse, do not give up! Plan ahead and think about what you will do the next time you get the urge to smoke. HOW WILL I FEEL WHEN I QUIT SMOKING? You  may have symptoms of withdrawal because your body is used to nicotine (the addictive substance in cigarettes). You may crave cigarettes, be irritable, feel very hungry, cough often, get headaches, or have difficulty concentrating. The withdrawal symptoms are only temporary. They are strongest when you first quit but will go away within 10-14 days. When withdrawal symptoms occur, stay in control. Think about your reasons for quitting. Remind yourself that these are signs that your body is healing and getting used to being without cigarettes. Remember that withdrawal symptoms are easier to treat than the major diseases that smoking can cause.  Even after the withdrawal is over, expect periodic urges to smoke. However, these cravings are generally short lived and will go away whether you smoke or not. Do not smoke! WHAT RESOURCES ARE AVAILABLE TO HELP ME QUIT SMOKING? Your health care provider can direct you to community resources or hospitals for support, which may include:  Group support.  Education.  Hypnosis.  Therapy.   This information is not intended to replace advice given to you by your health care provider. Make sure you discuss any questions you have with your health care provider.   Document Released: 01/25/2004 Document Revised: 05/19/2014 Document Reviewed: 10/14/2012 Elsevier Interactive Patient Education 2016 [employer]. Instructed to get labwork drawn at pre admit testing today.

## 2015-07-10 NOTE — Progress Notes (Signed)
Quick Note:   Normal fasting (NPO x 8 hours) glucose levels are between 65-99 mg/dl, with 2 hour fasting, levels are usually less than 140 mg/dl. Any random blood glucose level greater than 200 mg/dl is considered to be Diabetes.  While most low ALT level results indicate a normal healthy liver, that may not always be the case. A low-functioning or non-functioning liver, lacking normal levels of ALT activity to begin with, would not release a lot of ALT into the blood when damaged. People infected with the hepatitis C virus initially show high ALT levels in their blood, but these levels fall over time. Because the ALT test measures ALT levels at only one point in time, people with chronic hepatitis C infection may already have experienced the ALT peak well before blood was drawn for the ALT test. Urinary tract infections or malnutrition may also cause low blood ALT levels. ______ 

## 2015-07-10 NOTE — Progress Notes (Signed)
Quick Note:  Lab results reviewed and found to be within normal limits. ______ 

## 2015-07-14 LAB — TOXASSURE SELECT 13 (MW), URINE: PDF: 0

## 2015-07-26 NOTE — Progress Notes (Signed)
Quick Note:  NOTE: This forensic urine drug screen (UDS) test was conducted using a state-of-the-art ultra high performance liquid chromatography and mass spectrometry system (UPLC/MS-MS), the most sophisticated and accurate method available. UPLC/MS-MS is 1,000 times more precise and accurate than standard gas chromatography and mass spectrometry (GC/MS). This system can analyze 26 drug categories and 180 drug compounds.  The findings of this UDT were reported as abnormal due to inconsistencies with expected results. An expected substance was not present in the sample. Expectations were based on the medication history provided by the patient at the time of sample collection. These results are concerning due to the possibility of opioid diversion, or non-compliance with instructions on how to take the medication, including increase intake of medication early in the prescription refill, possibly running out towards the end of the prescribed period. ______ 

## 2015-10-03 ENCOUNTER — Ambulatory Visit: Payer: Medicare HMO | Attending: Pain Medicine | Admitting: Pain Medicine

## 2015-10-03 ENCOUNTER — Encounter: Payer: Self-pay | Admitting: Pain Medicine

## 2015-10-03 VITALS — BP 134/82 | HR 73 | Temp 98.5°F | Resp 18 | Ht 72.0 in | Wt 235.0 lb

## 2015-10-03 DIAGNOSIS — M109 Gout, unspecified: Secondary | ICD-10-CM | POA: Insufficient documentation

## 2015-10-03 DIAGNOSIS — F17210 Nicotine dependence, cigarettes, uncomplicated: Secondary | ICD-10-CM | POA: Diagnosis not present

## 2015-10-03 DIAGNOSIS — M545 Low back pain: Secondary | ICD-10-CM | POA: Diagnosis not present

## 2015-10-03 DIAGNOSIS — M170 Bilateral primary osteoarthritis of knee: Secondary | ICD-10-CM | POA: Diagnosis not present

## 2015-10-03 DIAGNOSIS — H269 Unspecified cataract: Secondary | ICD-10-CM | POA: Insufficient documentation

## 2015-10-03 DIAGNOSIS — E7800 Pure hypercholesterolemia, unspecified: Secondary | ICD-10-CM | POA: Insufficient documentation

## 2015-10-03 DIAGNOSIS — I69898 Other sequelae of other cerebrovascular disease: Secondary | ICD-10-CM | POA: Diagnosis not present

## 2015-10-03 DIAGNOSIS — G8929 Other chronic pain: Secondary | ICD-10-CM | POA: Diagnosis not present

## 2015-10-03 DIAGNOSIS — M25562 Pain in left knee: Secondary | ICD-10-CM | POA: Diagnosis not present

## 2015-10-03 DIAGNOSIS — M1000 Idiopathic gout, unspecified site: Secondary | ICD-10-CM | POA: Diagnosis not present

## 2015-10-03 DIAGNOSIS — Z5181 Encounter for therapeutic drug level monitoring: Secondary | ICD-10-CM | POA: Diagnosis not present

## 2015-10-03 DIAGNOSIS — M810 Age-related osteoporosis without current pathological fracture: Secondary | ICD-10-CM | POA: Diagnosis not present

## 2015-10-03 DIAGNOSIS — I509 Heart failure, unspecified: Secondary | ICD-10-CM | POA: Insufficient documentation

## 2015-10-03 DIAGNOSIS — M25579 Pain in unspecified ankle and joints of unspecified foot: Secondary | ICD-10-CM | POA: Diagnosis not present

## 2015-10-03 DIAGNOSIS — M79604 Pain in right leg: Secondary | ICD-10-CM | POA: Insufficient documentation

## 2015-10-03 DIAGNOSIS — R531 Weakness: Secondary | ICD-10-CM | POA: Insufficient documentation

## 2015-10-03 DIAGNOSIS — Z955 Presence of coronary angioplasty implant and graft: Secondary | ICD-10-CM | POA: Diagnosis not present

## 2015-10-03 DIAGNOSIS — I4891 Unspecified atrial fibrillation: Secondary | ICD-10-CM | POA: Insufficient documentation

## 2015-10-03 DIAGNOSIS — Z7982 Long term (current) use of aspirin: Secondary | ICD-10-CM | POA: Diagnosis not present

## 2015-10-03 DIAGNOSIS — Z79891 Long term (current) use of opiate analgesic: Secondary | ICD-10-CM | POA: Diagnosis not present

## 2015-10-03 DIAGNOSIS — M792 Neuralgia and neuritis, unspecified: Secondary | ICD-10-CM | POA: Diagnosis not present

## 2015-10-03 DIAGNOSIS — M7121 Synovial cyst of popliteal space [Baker], right knee: Secondary | ICD-10-CM | POA: Insufficient documentation

## 2015-10-03 DIAGNOSIS — F1190 Opioid use, unspecified, uncomplicated: Secondary | ICD-10-CM

## 2015-10-03 DIAGNOSIS — M25561 Pain in right knee: Secondary | ICD-10-CM | POA: Insufficient documentation

## 2015-10-03 DIAGNOSIS — Z951 Presence of aortocoronary bypass graft: Secondary | ICD-10-CM | POA: Diagnosis not present

## 2015-10-03 DIAGNOSIS — I10 Essential (primary) hypertension: Secondary | ICD-10-CM | POA: Diagnosis not present

## 2015-10-03 DIAGNOSIS — I69959 Hemiplegia and hemiparesis following unspecified cerebrovascular disease affecting unspecified side: Secondary | ICD-10-CM | POA: Diagnosis not present

## 2015-10-03 DIAGNOSIS — M25569 Pain in unspecified knee: Secondary | ICD-10-CM | POA: Diagnosis present

## 2015-10-03 MED ORDER — GABAPENTIN 800 MG PO TABS: 1200.0000 mg | ORAL_TABLET | Freq: Four times a day (QID) | ORAL | Status: DC

## 2015-10-03 MED ORDER — OXYCODONE HCL 5 MG PO TABS: 5.0000 mg | ORAL_TABLET | Freq: Every day | ORAL | Status: DC | PRN

## 2015-10-03 MED ORDER — ALLOPURINOL 100 MG PO TABS
200.0000 mg | ORAL_TABLET | Freq: Every day | ORAL | Status: AC
Start: 2015-10-03 — End: ?

## 2015-10-03 NOTE — Patient Instructions (Addendum)
Smoking Cessation, Tips for Success If you are ready to quit smoking, congratulations! You have chosen to help yourself be healthier. Cigarettes bring nicotine, tar, carbon monoxide, and other irritants into your body. Your lungs, heart, and blood vessels will be able to work better without these poisons. There are many different ways to quit smoking. Nicotine gum, nicotine patches, a nicotine inhaler, or nicotine nasal spray can help with physical craving. Hypnosis, support groups, and medicines help break the habit of smoking. WHAT THINGS CAN I DO TO MAKE QUITTING EASIER?  Here are some tips to help you quit for good:  Pick a date when you will quit smoking completely. Tell all of your friends and family about your plan to quit on that date.  Do not try to slowly cut down on the number of cigarettes you are smoking. Pick a quit date and quit smoking completely starting on that day.  Throw away all cigarettes.   Clean and remove all ashtrays from your home, work, and car.  On a card, write down your reasons for quitting. Carry the card with you and read it when you get the urge to smoke.  Cleanse your body of nicotine. Drink enough water and fluids to keep your urine clear or pale yellow. Do this after quitting to flush the nicotine from your body.  Learn to predict your moods. Do not let a bad situation be your excuse to have a cigarette. Some situations in your life might tempt you into wanting a cigarette.  Never have "just one" cigarette. It leads to wanting another and another. Remind yourself of your decision to quit.  Change habits associated with smoking. If you smoked while driving or when feeling stressed, try other activities to replace smoking. Stand up when drinking your coffee. Brush your teeth after eating. Sit in a different chair when you read the paper. Avoid alcohol while trying to quit, and try to drink fewer caffeinated beverages. Alcohol and caffeine may urge you to  smoke.  Avoid foods and drinks that can trigger a desire to smoke, such as sugary or spicy foods and alcohol.  Ask people who smoke not to smoke around you.  Have something planned to do right after eating or having a cup of coffee. For example, plan to take a walk or exercise.  Try a relaxation exercise to calm you down and decrease your stress. Remember, you may be tense and nervous for the first 2 weeks after you quit, but this will pass.  Find new activities to keep your hands busy. Play with a pen, coin, or rubber band. Doodle or draw things on paper.  Brush your teeth right after eating. This will help cut down on the craving for the taste of tobacco after meals. You can also try mouthwash.   Use oral substitutes in place of cigarettes. Try using lemon drops, carrots, cinnamon sticks, or chewing gum. Keep them handy so they are available when you have the urge to smoke.  When you have the urge to smoke, try deep breathing.  Designate your home as a nonsmoking area.  If you are a heavy smoker, ask your health care provider about a prescription for nicotine chewing gum. It can ease your withdrawal from nicotine.  Reward yourself. Set aside the cigarette money you save and buy yourself something nice.  Look for support from others. Join a support group or smoking cessation program. Ask someone at home or at work to help you with your plan   to quit smoking.  Always ask yourself, "Do I need this cigarette or is this just a reflex?" Tell yourself, "Today, I choose not to smoke," or "I do not want to smoke." You are reminding yourself of your decision to quit.  Do not replace cigarette smoking with electronic cigarettes (commonly called e-cigarettes). The safety of e-cigarettes is unknown, and some may contain harmful chemicals.  If you relapse, do not give up! Plan ahead and think about what you will do the next time you get the urge to smoke. HOW WILL I FEEL WHEN I QUIT SMOKING? You  may have symptoms of withdrawal because your body is used to nicotine (the addictive substance in cigarettes). You may crave cigarettes, be irritable, feel very hungry, cough often, get headaches, or have difficulty concentrating. The withdrawal symptoms are only temporary. They are strongest when you first quit but will go away within 10-14 days. When withdrawal symptoms occur, stay in control. Think about your reasons for quitting. Remind yourself that these are signs that your body is healing and getting used to being without cigarettes. Remember that withdrawal symptoms are easier to treat than the major diseases that smoking can cause.  Even after the withdrawal is over, expect periodic urges to smoke. However, these cravings are generally short lived and will go away whether you smoke or not. Do not smoke! WHAT RESOURCES ARE AVAILABLE TO HELP ME QUIT SMOKING? Your health care provider can direct you to community resources or hospitals for support, which may include:  Group support.  Education.  Hypnosis.  Therapy.   This information is not intended to replace advice given to you by your health care provider. Make sure you discuss any questions you have with your health care provider.   Document Released: 01/25/2004 Document Revised: 05/19/2014 Document Reviewed: 10/14/2012 Elsevier Interactive Patient Education 2016 [employer] If schedules a procedure STOP PLAVIX FOR 7 DAYS PRIOR TO PROCEDURE
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
## 2015-10-03 NOTE — Progress Notes (Signed)
Safety precautions to be maintained throughout the outpatient stay will include: orient to surroundings, keep bed in low position, maintain call bell within reach at all times, provide assistance with transfer out of bed and ambulation. Oxycodone 5 mg pill count # 5/150  Filled 09-07-15

## 2015-10-05 ENCOUNTER — Telehealth: Payer: Self-pay | Admitting: *Deleted

## 2015-10-05 ENCOUNTER — Telehealth: Payer: Self-pay

## 2015-10-05 NOTE — Telephone Encounter (Signed)
Lm made the pt aware that I returned his call.Marland Kitchen.Marland Kitchen.TD

## 2015-10-05 NOTE — Telephone Encounter (Signed)
Needs Dr Shauna HughNaveria to change prescription date on medications

## 2015-10-05 NOTE — Telephone Encounter (Signed)
Spoke with Mr Linton Rumpalbert re; his oxycodone 5 mg IR Rx and fill date landing on 10/07/15 which is a Sunday and they are closed on Monday.  Told Mr  Glennie Hawkalbert I would call [facility] and given this approval for 1 day early due to being closed on Sunday and the Holiday being on Monday.   Called to pharmacy and spoke with Ree KidaJack to let him know okay to fill 1 day early and to remind patient that medications must last til the next fill date on Rx.
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
## 2015-10-07 IMAGING — CR RIGHT FOOT COMPLETE - 3+ VIEW
1 series · 3 of 3 positions shown · non-contrast
Comparison: None.

CLINICAL DATA: Fall, pain

EXAM:
RIGHT FOOT COMPLETE - 3+ VIEW

[Series 1: ap · 0.17mm/px · 3 of 3 slices shown]
[im 1/3]
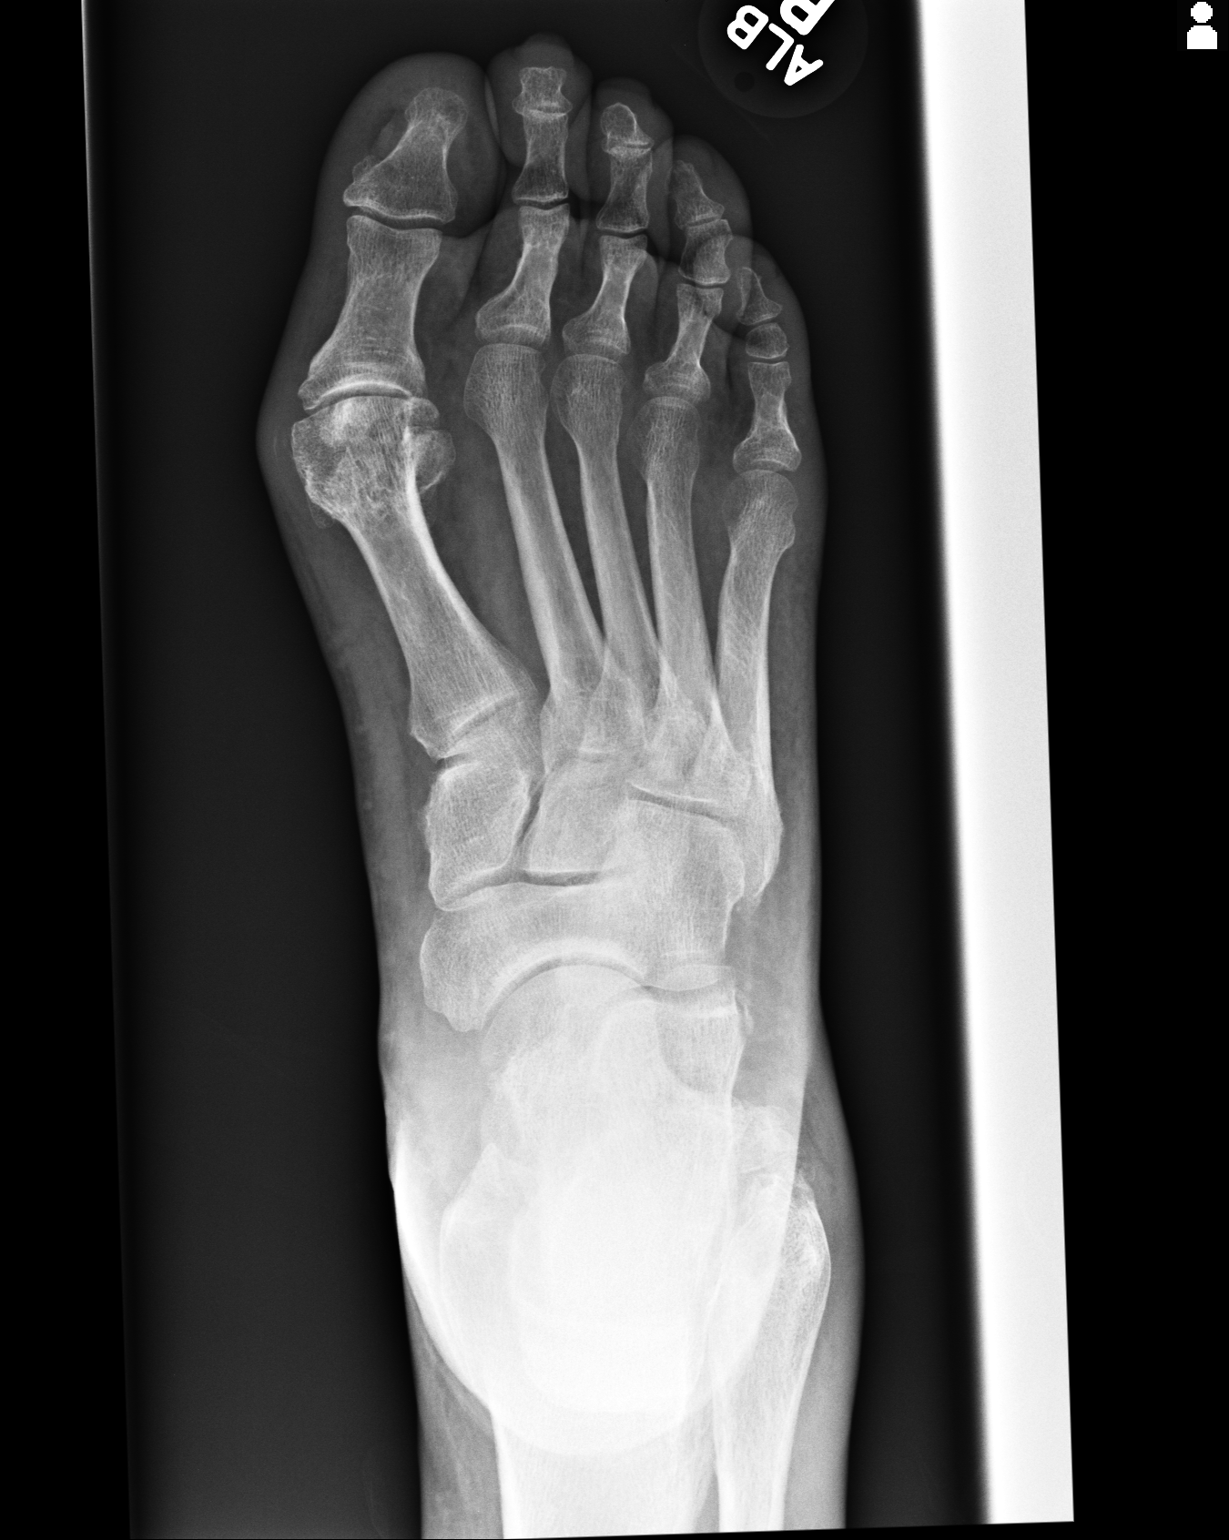
[im 2/3]
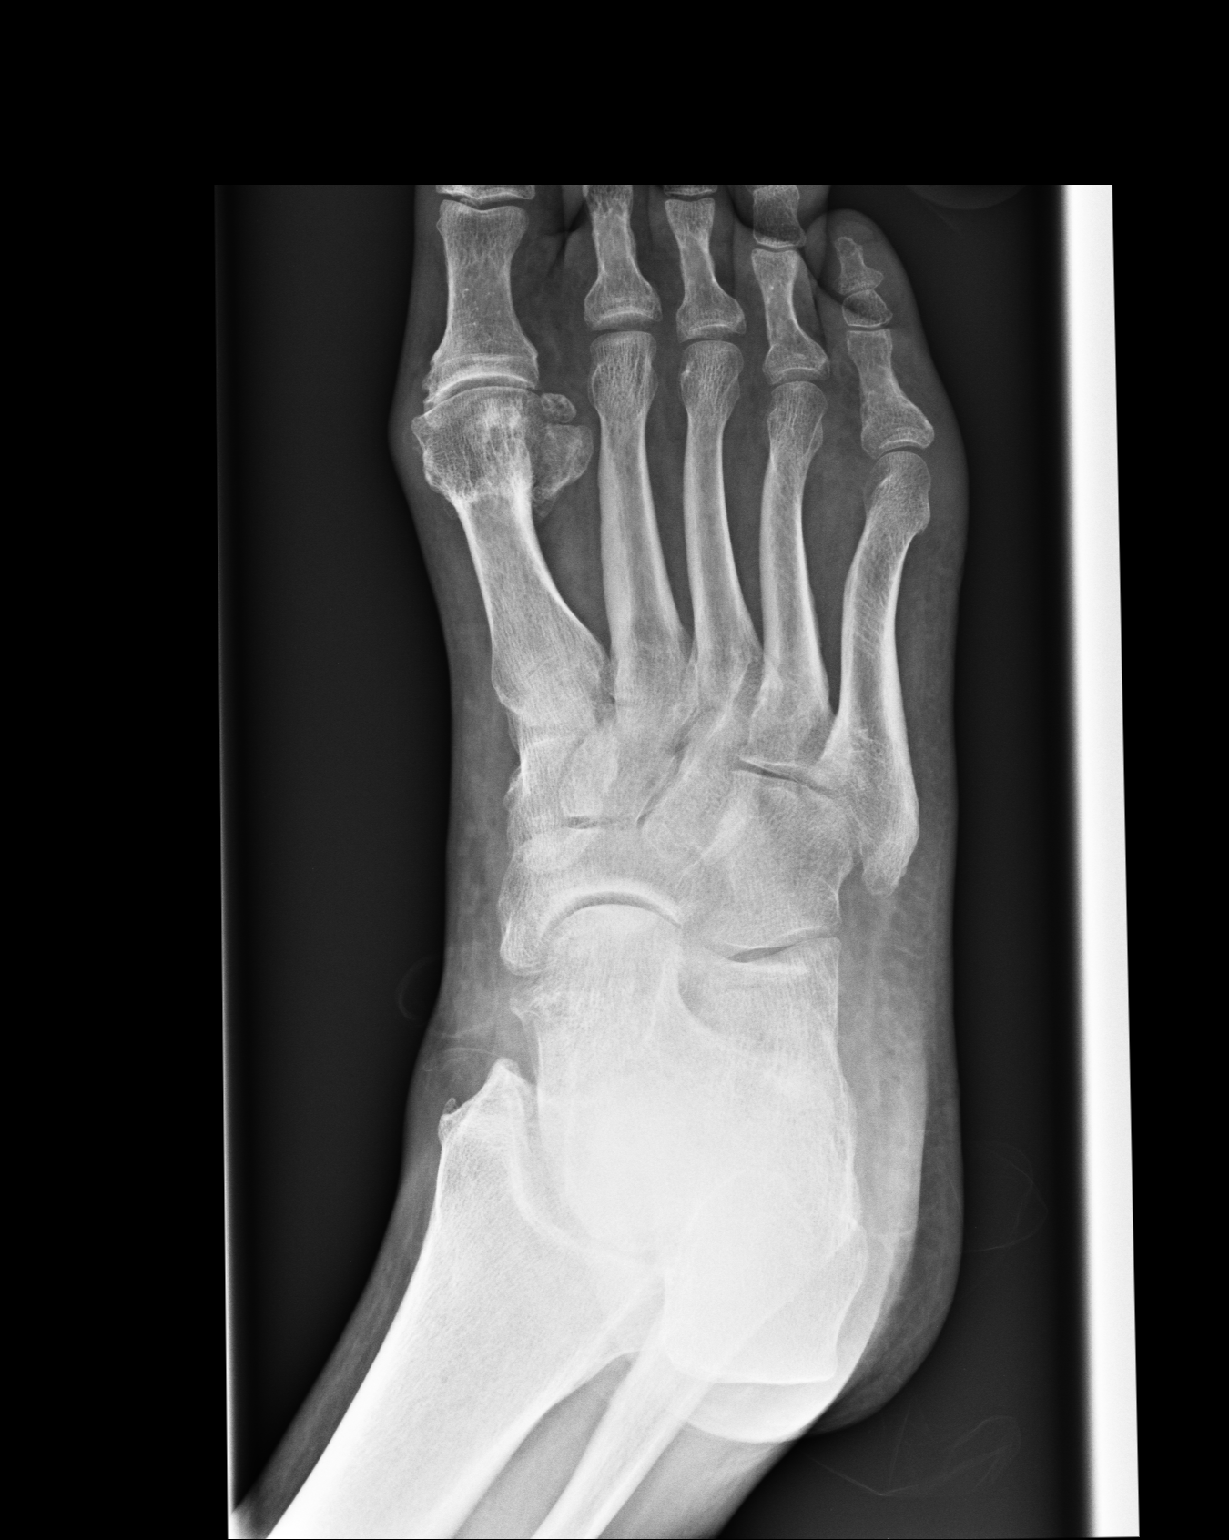
[im 3/3]
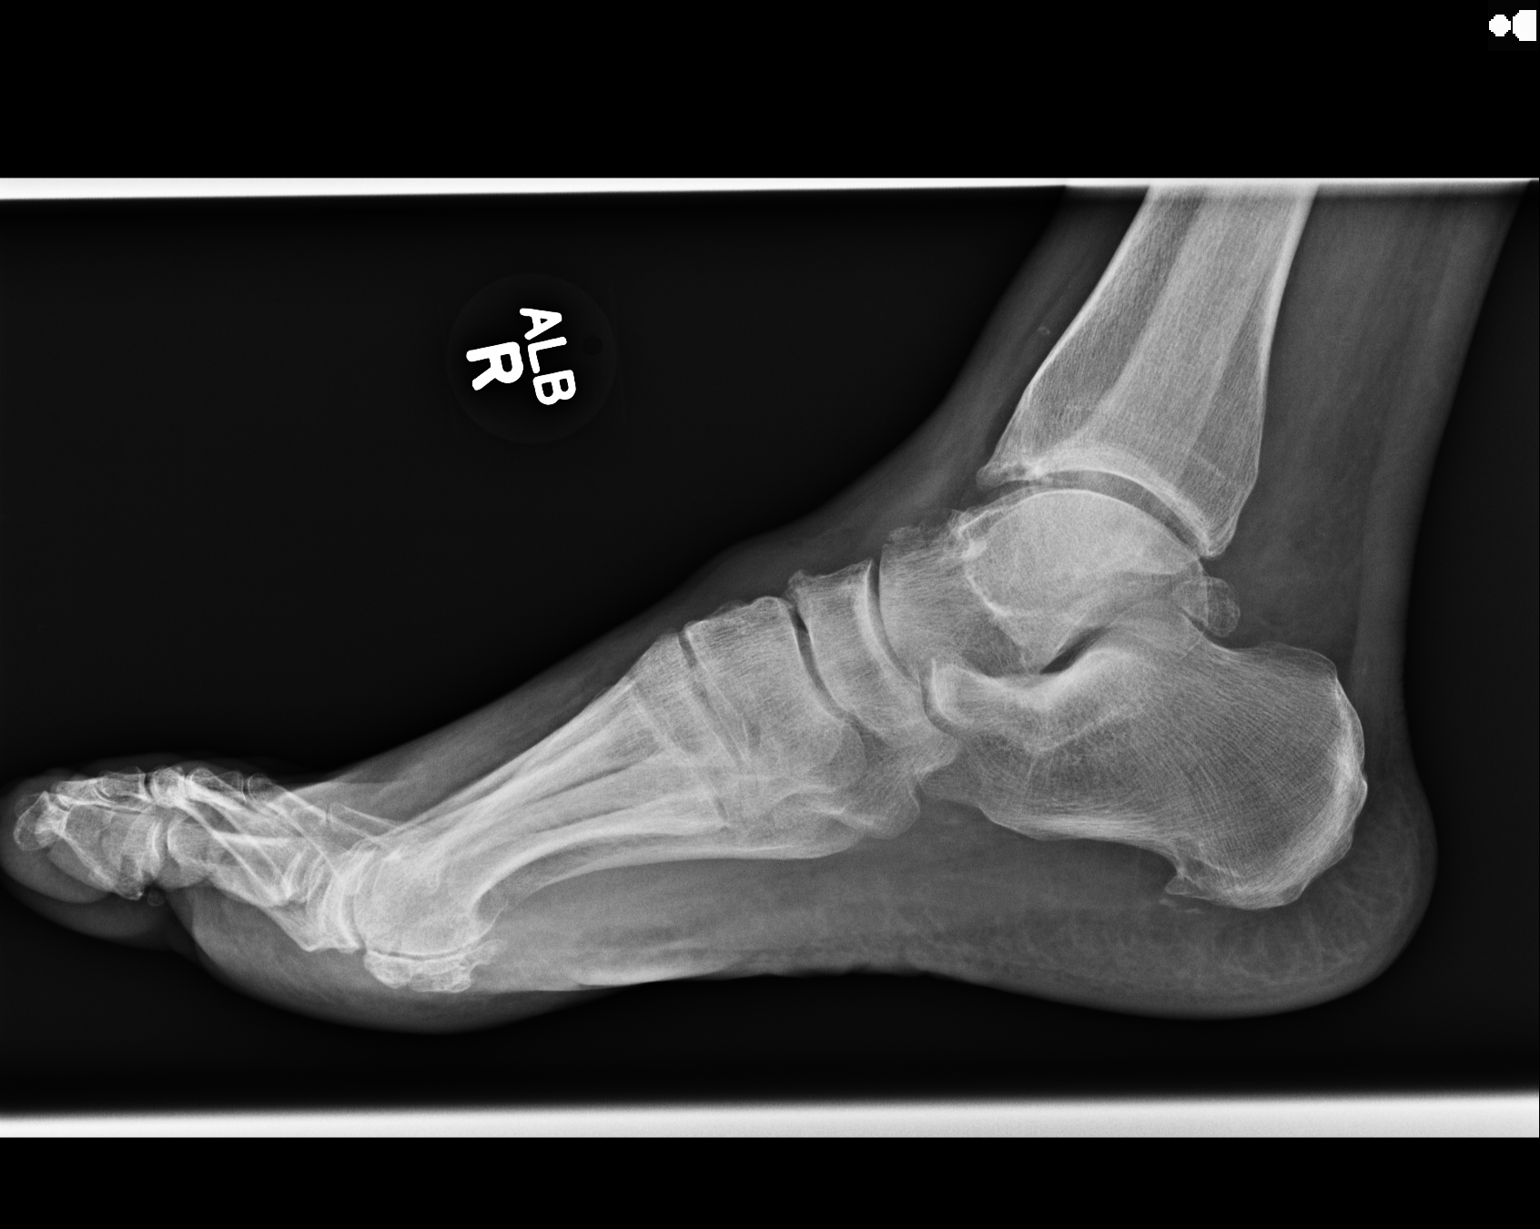

[3 of 3 positions shown; findings below may reference images not displayed]

FINDINGS: The right foot demonstrates no fracture or dislocation. There is
hallux valgus with moderate osteoarthritis of the first MTP joint.
There is a small plantar calcaneal spur. There is no soft tissue
abnormality. There is no subcutaneous emphysema or radiopaque
foreign bodies.
IMPRESSION: No acute osseous injury of the right foot.

## 2015-10-11 LAB — TOXASSURE SELECT 13 (MW), URINE

## 2015-10-12 DIAGNOSIS — M1A0610 Idiopathic chronic gout, right knee, without tophus (tophi): Secondary | ICD-10-CM | POA: Insufficient documentation

## 2015-12-05 ENCOUNTER — Telehealth: Payer: Self-pay | Admitting: Pain Medicine

## 2015-12-05 ENCOUNTER — Other Ambulatory Visit: Payer: Self-pay | Admitting: Pain Medicine

## 2015-12-05 DIAGNOSIS — G8929 Other chronic pain: Secondary | ICD-10-CM

## 2015-12-05 NOTE — Telephone Encounter (Addendum)
Please call patient to discuss how many scripts he received on his last visit, he thought he turned them all in to pharmacy and they say the do not have them.  [phone]  [facility] called to say they cut a script off and it got shredded by mistake, please call if you have any questions

## 2015-12-05 NOTE — Telephone Encounter (Signed)
Sent to Dr Shireen Quan to advise

## 2015-12-06 ENCOUNTER — Telehealth: Payer: Self-pay | Admitting: *Deleted

## 2015-12-06 ENCOUNTER — Other Ambulatory Visit: Payer: Self-pay | Admitting: Pain Medicine

## 2015-12-06 DIAGNOSIS — G8929 Other chronic pain: Secondary | ICD-10-CM

## 2015-12-06 MED ORDER — OXYCODONE HCL 5 MG PO TABS: 5.0000 mg | ORAL_TABLET | Freq: Every day | ORAL | 0 refills | Status: DC | PRN

## 2015-12-06 NOTE — Telephone Encounter (Signed)
Spoke with pharmacy re; missing Rx. Dr Laban Emperor has stated that if pharmacy would take responsibility for lost Rx he would rewrite.  Pharmacy states that they will send a written statement that they are taking responsibility and will send written statement.  [facility] notified that they will do this and he can pick up the Rx on tomorrow, Friday 12/07/15 between 8 and 12.  Medication Oxycodone IR 5 mg sig 1 po 5 times per day as needed for severe pain  Qty 150.

## 2015-12-06 NOTE — Telephone Encounter (Signed)
Spoke with [facility] and then called [facility].  They state that they do not have the Rx for 12/06/15 to last until 01/05/16.  State that they had a relief pharmacist on that day and are unsure if maybe it was shredded by accident but that they do not have it.  Spoke back with the [facility] and ask him if he was absolutely sure that the dropped the Rx as it would have been on a separate paper apart from May and June.  [facility] states he is certain that he dropped off the July Rx.  Ultimately, I told the [facility] that while dropping the Rx's off at [facility] normally works out well that sometimes  They can make mistakes and that the Rx's are his responsibility to keep with.  I told him that I would speak with [facility] re; this situation and let him know his response.

## 2015-12-11 HISTORY — PX: MOUTH SURGERY: SHX715

## 2016-01-03 ENCOUNTER — Encounter: Payer: Self-pay | Admitting: Pain Medicine

## 2016-01-03 ENCOUNTER — Ambulatory Visit: Payer: Medicare HMO | Attending: Pain Medicine | Admitting: Pain Medicine

## 2016-01-03 VITALS — BP 144/74 | HR 64 | Temp 97.8°F | Resp 16 | Ht 72.0 in | Wt 239.0 lb

## 2016-01-03 DIAGNOSIS — M21371 Foot drop, right foot: Secondary | ICD-10-CM | POA: Diagnosis not present

## 2016-01-03 DIAGNOSIS — M818 Other osteoporosis without current pathological fracture: Secondary | ICD-10-CM | POA: Insufficient documentation

## 2016-01-03 DIAGNOSIS — M79604 Pain in right leg: Secondary | ICD-10-CM | POA: Diagnosis present

## 2016-01-03 DIAGNOSIS — F17210 Nicotine dependence, cigarettes, uncomplicated: Secondary | ICD-10-CM | POA: Diagnosis not present

## 2016-01-03 DIAGNOSIS — G609 Hereditary and idiopathic neuropathy, unspecified: Secondary | ICD-10-CM | POA: Diagnosis not present

## 2016-01-03 DIAGNOSIS — M79601 Pain in right arm: Secondary | ICD-10-CM | POA: Diagnosis present

## 2016-01-03 DIAGNOSIS — I6521 Occlusion and stenosis of right carotid artery: Secondary | ICD-10-CM | POA: Insufficient documentation

## 2016-01-03 DIAGNOSIS — Z79891 Long term (current) use of opiate analgesic: Secondary | ICD-10-CM | POA: Diagnosis not present

## 2016-01-03 DIAGNOSIS — M25561 Pain in right knee: Secondary | ICD-10-CM | POA: Diagnosis not present

## 2016-01-03 DIAGNOSIS — M5416 Radiculopathy, lumbar region: Secondary | ICD-10-CM | POA: Diagnosis not present

## 2016-01-03 DIAGNOSIS — M7121 Synovial cyst of popliteal space [Baker], right knee: Secondary | ICD-10-CM | POA: Insufficient documentation

## 2016-01-03 DIAGNOSIS — G8929 Other chronic pain: Secondary | ICD-10-CM | POA: Insufficient documentation

## 2016-01-03 DIAGNOSIS — Z981 Arthrodesis status: Secondary | ICD-10-CM | POA: Insufficient documentation

## 2016-01-03 DIAGNOSIS — I252 Old myocardial infarction: Secondary | ICD-10-CM | POA: Insufficient documentation

## 2016-01-03 DIAGNOSIS — M170 Bilateral primary osteoarthritis of knee: Secondary | ICD-10-CM | POA: Insufficient documentation

## 2016-01-03 DIAGNOSIS — I6990 Unspecified sequelae of unspecified cerebrovascular disease: Secondary | ICD-10-CM | POA: Insufficient documentation

## 2016-01-03 DIAGNOSIS — M25579 Pain in unspecified ankle and joints of unspecified foot: Secondary | ICD-10-CM | POA: Diagnosis not present

## 2016-01-03 DIAGNOSIS — E785 Hyperlipidemia, unspecified: Secondary | ICD-10-CM | POA: Insufficient documentation

## 2016-01-03 DIAGNOSIS — Z951 Presence of aortocoronary bypass graft: Secondary | ICD-10-CM | POA: Insufficient documentation

## 2016-01-03 DIAGNOSIS — I2510 Atherosclerotic heart disease of native coronary artery without angina pectoris: Secondary | ICD-10-CM | POA: Diagnosis not present

## 2016-01-03 DIAGNOSIS — M545 Low back pain: Secondary | ICD-10-CM | POA: Diagnosis not present

## 2016-01-03 DIAGNOSIS — E7800 Pure hypercholesterolemia, unspecified: Secondary | ICD-10-CM | POA: Diagnosis not present

## 2016-01-03 DIAGNOSIS — M25562 Pain in left knee: Secondary | ICD-10-CM | POA: Diagnosis not present

## 2016-01-03 DIAGNOSIS — Z9889 Other specified postprocedural states: Secondary | ICD-10-CM | POA: Insufficient documentation

## 2016-01-03 DIAGNOSIS — I4891 Unspecified atrial fibrillation: Secondary | ICD-10-CM | POA: Diagnosis not present

## 2016-01-03 DIAGNOSIS — M792 Neuralgia and neuritis, unspecified: Secondary | ICD-10-CM | POA: Diagnosis not present

## 2016-01-03 DIAGNOSIS — M10061 Idiopathic gout, right knee: Secondary | ICD-10-CM | POA: Diagnosis not present

## 2016-01-03 DIAGNOSIS — Z7982 Long term (current) use of aspirin: Secondary | ICD-10-CM | POA: Insufficient documentation

## 2016-01-03 DIAGNOSIS — I10 Essential (primary) hypertension: Secondary | ICD-10-CM | POA: Diagnosis not present

## 2016-01-03 DIAGNOSIS — R531 Weakness: Secondary | ICD-10-CM | POA: Diagnosis not present

## 2016-01-03 DIAGNOSIS — Z955 Presence of coronary angioplasty implant and graft: Secondary | ICD-10-CM | POA: Insufficient documentation

## 2016-01-03 MED ORDER — OXYCODONE HCL 5 MG PO TABS: 5.0000 mg | ORAL_TABLET | Freq: Every day | ORAL | 0 refills | Status: DC | PRN

## 2016-01-03 MED ORDER — GABAPENTIN 800 MG PO TABS: 1200.0000 mg | ORAL_TABLET | Freq: Four times a day (QID) | ORAL | 2 refills | Status: AC

## 2016-01-03 NOTE — Patient Instructions (Signed)
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
## 2016-01-03 NOTE — Progress Notes (Addendum)
Patient here today for medication refill.  Patient states that he had oral surgery approx. 45 days ago.  Patient denied pain medicine from oral surgeon because he thought it would break his contract.  He told them that he was being seen by a pain clinic and he had his own medicine.  Patient was instructed to increase his dosage to 1 tablet q 2 -3 hours for oral pain.  Patient educated on rules of pain clinic re; acute injury or surgical procedures.  Patient verbalizes u/o information.  Patient was prescribed pain medication from oral clinic but states he tore it up and threw it away.   Safety precautions to be maintained throughout the outpatient stay will include: orient to surroundings, keep bed in low position, maintain call bell within reach at all times, provide assistance with transfer out of bed and ambulation.   Oxycodone IR 5 mg 0/150 last fill on 12/07/15

## 2016-02-28 ENCOUNTER — Ambulatory Visit: Payer: Medicare HMO | Attending: Pain Medicine | Admitting: Pain Medicine

## 2016-02-28 ENCOUNTER — Encounter: Payer: Self-pay | Admitting: Pain Medicine

## 2016-02-28 VITALS — BP 135/83 | HR 70 | Temp 98.1°F | Resp 16 | Ht 72.0 in | Wt 237.0 lb

## 2016-02-28 DIAGNOSIS — M25561 Pain in right knee: Secondary | ICD-10-CM | POA: Diagnosis present

## 2016-02-28 DIAGNOSIS — M79604 Pain in right leg: Secondary | ICD-10-CM | POA: Insufficient documentation

## 2016-02-28 DIAGNOSIS — Z9889 Other specified postprocedural states: Secondary | ICD-10-CM | POA: Diagnosis not present

## 2016-02-28 DIAGNOSIS — G894 Chronic pain syndrome: Secondary | ICD-10-CM

## 2016-02-28 DIAGNOSIS — M79601 Pain in right arm: Secondary | ICD-10-CM | POA: Insufficient documentation

## 2016-02-28 DIAGNOSIS — F1190 Opioid use, unspecified, uncomplicated: Secondary | ICD-10-CM

## 2016-02-28 DIAGNOSIS — Z760 Encounter for issue of repeat prescription: Secondary | ICD-10-CM | POA: Insufficient documentation

## 2016-02-28 DIAGNOSIS — Z7901 Long term (current) use of anticoagulants: Secondary | ICD-10-CM | POA: Insufficient documentation

## 2016-02-28 DIAGNOSIS — M25562 Pain in left knee: Secondary | ICD-10-CM | POA: Diagnosis present

## 2016-02-28 DIAGNOSIS — Z79891 Long term (current) use of opiate analgesic: Secondary | ICD-10-CM

## 2016-02-28 DIAGNOSIS — M792 Neuralgia and neuritis, unspecified: Secondary | ICD-10-CM

## 2016-02-28 DIAGNOSIS — Z79899 Other long term (current) drug therapy: Secondary | ICD-10-CM | POA: Insufficient documentation

## 2016-02-28 DIAGNOSIS — G8929 Other chronic pain: Secondary | ICD-10-CM

## 2016-02-28 MED ORDER — OXYCODONE HCL 5 MG PO TABS: 5.0000 mg | ORAL_TABLET | Freq: Every day | ORAL | 0 refills | Status: AC | PRN

## 2016-02-28 NOTE — Patient Instructions (Addendum)
Please hold xarelto for 3 days prior to knee injection.     GENERAL RISKS AND COMPLICATIONS  What are the risk, side effects and possible complications? Generally speaking, most procedures are safe.  However, with any procedure there are risks, side effects, and the possibility of complications.  The risks and complications are dependent upon the sites that are lesioned, or the type of nerve block to be performed.  The closer the procedure is to the spine, the more serious the risks are.  Great care is taken when placing the radio frequency needles, block needles or lesioning probes, but sometimes complications can occur. 1. Infection: Any time there is an injection through the skin, there is a risk of infection.  This is why sterile conditions are used for these blocks.  There are four possible types of infection. 1. Localized skin infection. 2. Central Nervous System Infection-This can be in the form of Meningitis, which can be deadly. 3. Epidural Infections-This can be in the form of an epidural abscess, which can cause pressure inside of the spine, causing compression of the spinal cord with subsequent paralysis. This would require an emergency surgery to decompress, and there are no guarantees that the patient would recover from the paralysis. 4. Discitis-This is an infection of the intervertebral discs.  It occurs in about 1% of discography procedures.  It is difficult to treat and it may lead to surgery.        2. Pain: the needles have to go through skin and soft tissues, will cause soreness.       3. Damage to internal structures:  The nerves to be lesioned may be near blood vessels or    other nerves which can be potentially damaged.       4. Bleeding: Bleeding is more common if the patient is taking blood thinners such as  aspirin, Coumadin, Ticiid, Plavix, etc., or if he/she have some genetic predisposition  such as hemophilia. Bleeding into the spinal canal can cause compression of the  spinal  cord with subsequent paralysis.  This would require an emergency surgery to  decompress and there are no guarantees that the patient would recover from the  paralysis.       5. Pneumothorax:  Puncturing of a lung is a possibility, every time a needle is introduced in  the area of the chest or upper back.  Pneumothorax refers to free air around the  collapsed lung(s), inside of the thoracic cavity (chest cavity).  Another two possible  complications related to a similar event would include: Hemothorax and Chylothorax.   These are variations of the Pneumothorax, where instead of air around the collapsed  lung(s), you may have blood or chyle, respectively.       6. Spinal headaches: They may occur with any procedures in the area of the spine.       7. Persistent CSF (Cerebro-Spinal Fluid) leakage: This is a rare problem, but may occur  with prolonged intrathecal or epidural catheters either due to the formation of a fistulous  track or a dural tear.       8. Nerve damage: By working so close to the spinal cord, there is always a possibility of  nerve damage, which could be as serious as a permanent spinal cord injury with  paralysis.       9. Death:  Although rare, severe deadly allergic reactions known as "Anaphylactic  reaction" can occur to any of the medications used.  10. Worsening of the symptoms:  We can always make thing worse.  What are the chances of something like this happening? Chances of any of this occuring are extremely low.  By statistics, you have more of a chance of getting killed in a motor vehicle accident: while driving to the hospital than any of the above occurring .  Nevertheless, you should be aware that they are possibilities.  In general, it is similar to taking a shower.  Everybody knows that you can slip, hit your head and get killed.  Does that mean that you should not shower again?  Nevertheless always keep in mind that statistics do not mean anything if you happen  to be on the wrong side of them.  Even if a procedure has a 1 (one) in a 1,000,000 (million) chance of going wrong, it you happen to be that one..Also, keep in mind that by statistics, you have more of a chance of having something go wrong when taking medications.  Who should not have this procedure? If you are on a blood thinning medication (e.g. Coumadin, Plavix, see list of "Blood Thinners"), or if you have an active infection going on, you should not have the procedure.  If you are taking any blood thinners, please inform your physician.  How should I prepare for this procedure?  Do not eat or drink anything at least six hours prior to the procedure.  Bring a driver with you .  It cannot be a taxi.  Come accompanied by an adult that can drive you back, and that is strong enough to help you if your legs get weak or numb from the local anesthetic.  Take all of your medicines the morning of the procedure with just enough water to swallow them.  If you have diabetes, make sure that you are scheduled to have your procedure done first thing in the morning, whenever possible.  If you have diabetes, take only half of your insulin dose and notify our nurse that you have done so as soon as you arrive at the clinic.  If you are diabetic, but only take blood sugar pills (oral hypoglycemic), then do not take them on the morning of your procedure.  You may take them after you have had the procedure.  Do not take aspirin or any aspirin-containing medications, at least eleven (11) days prior to the procedure.  They may prolong bleeding.  Wear loose fitting clothing that may be easy to take off and that you would not mind if it got stained with Betadine or blood.  Do not wear any jewelry or perfume  Remove any nail coloring.  It will interfere with some of our monitoring equipment.  NOTE: Remember that this is not meant to be interpreted as a complete list of all possible complications.  Unforeseen  problems may occur.  BLOOD THINNERS The following drugs contain aspirin or other products, which can cause increased bleeding during surgery and should not be taken for 2 weeks prior to and 1 week after surgery.  If you should need take something for relief of minor pain, you may take acetaminophen which is found in Tylenol,m Datril, Anacin-3 and Panadol. It is not blood thinner. The products listed below are.  Do not take any of the products listed below in addition to any listed on your instruction sheet.  A.P.C or A.P.C with Codeine Codeine Phosphate Capsules #3 Ibuprofen Ridaura  ABC compound Congesprin Imuran rimadil  Advil Cope Indocin Robaxisal  Alka-Seltzer Effervescent Pain Reliever and Antacid Coricidin or Coricidin-D  Indomethacin Rufen  Alka-Seltzer plus Cold Medicine Cosprin Ketoprofen S-A-C Tablets  Anacin Analgesic Tablets or Capsules Coumadin Korlgesic Salflex  Anacin Extra Strength Analgesic tablets or capsules CP-2 Tablets Lanoril Salicylate  Anaprox Cuprimine Capsules Levenox Salocol  Anexsia-D Dalteparin Magan Salsalate  Anodynos Darvon compound Magnesium Salicylate Sine-off  Ansaid Dasin Capsules Magsal Sodium Salicylate  Anturane Depen Capsules Marnal Soma  APF Arthritis pain formula Dewitt's Pills Measurin Stanback  Argesic Dia-Gesic Meclofenamic Sulfinpyrazone  Arthritis Bayer Timed Release Aspirin Diclofenac Meclomen Sulindac  Arthritis pain formula Anacin Dicumarol Medipren Supac  Analgesic (Safety coated) Arthralgen Diffunasal Mefanamic Suprofen  Arthritis Strength Bufferin Dihydrocodeine Mepro Compound Suprol  Arthropan liquid Dopirydamole Methcarbomol with Aspirin Synalgos  ASA tablets/Enseals Disalcid Micrainin Tagament  Ascriptin Doan's Midol Talwin  Ascriptin A/D Dolene Mobidin Tanderil  Ascriptin Extra Strength Dolobid Moblgesic Ticlid  Ascriptin with Codeine Doloprin or Doloprin with Codeine Momentum Tolectin  Asperbuf Duoprin Mono-gesic Trendar   Aspergum Duradyne Motrin or Motrin IB Triminicin  Aspirin plain, buffered or enteric coated Durasal Myochrisine Trigesic  Aspirin Suppositories Easprin Nalfon Trillsate  Aspirin with Codeine Ecotrin Regular or Extra Strength Naprosyn Uracel  Atromid-S Efficin Naproxen Ursinus  Auranofin Capsules Elmiron Neocylate Vanquish  Axotal Emagrin Norgesic Verin  Azathioprine Empirin or Empirin with Codeine Normiflo Vitamin E  Azolid Emprazil Nuprin Voltaren  Bayer Aspirin plain, buffered or children's or timed BC Tablets or powders Encaprin Orgaran Warfarin Sodium  Buff-a-Comp Enoxaparin Orudis Zorpin  Buff-a-Comp with Codeine Equegesic Os-Cal-Gesic   Buffaprin Excedrin plain, buffered or Extra Strength Oxalid   Bufferin Arthritis Strength Feldene Oxphenbutazone   Bufferin plain or Extra Strength Feldene Capsules Oxycodone with Aspirin   Bufferin with Codeine Fenoprofen Fenoprofen Pabalate or Pabalate-SF   Buffets II Flogesic Panagesic   Buffinol plain or Extra Strength Florinal or Florinal with Codeine Panwarfarin   Buf-Tabs Flurbiprofen Penicillamine   Butalbital Compound Four-way cold tablets Penicillin   Butazolidin Fragmin Pepto-Bismol   Carbenicillin Geminisyn Percodan   Carna Arthritis Reliever Geopen Persantine   Carprofen Gold's salt Persistin   Chloramphenicol Goody's Phenylbutazone   Chloromycetin Haltrain Piroxlcam   Clmetidine heparin Plaquenil   Cllnoril Hyco-pap Ponstel   Clofibrate Hydroxy chloroquine Propoxyphen         Before stopping any of these medications, be sure to consult the physician who ordered them.  Some, such as Coumadin (Warfarin) are ordered to prevent or treat serious conditions such as "deep thrombosis", "pumonary embolisms", and other heart problems.  The amount of time that you may need off of the medication may also vary with the medication and the reason for which you were taking it.  If you are taking any of these medications, please make sure you  notify your pain physician before you undergo any procedures.
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
## 2016-02-28 NOTE — Progress Notes (Signed)
Nursing Pain Medication Assessment:  Safety precautions to be maintained throughout the outpatient stay will include: orient to surroundings, keep bed in low position, maintain call bell within reach at all times, provide assistance with transfer out of bed and ambulation.  Medication Inspection Compliance: Mr. Terrence Miller did not comply with our request to bring his pills to be counted. He was reminded that bringing the medication bottles, even when empty, is a requirement. Pill Count: No pills available to be counted today. Medication: Oxycodone IR Filled Date: 09 /25/ 2017

## 2016-03-07 LAB — TOXASSURE SELECT 13 (MW), URINE

## 2016-03-09 ENCOUNTER — Encounter: Payer: Self-pay | Admitting: Pain Medicine

## 2016-03-09 DIAGNOSIS — Z9114 Patient's other noncompliance with medication regimen: Secondary | ICD-10-CM | POA: Insufficient documentation

## 2016-03-09 DIAGNOSIS — R892 Abnormal level of other drugs, medicaments and biological substances in specimens from other organs, systems and tissues: Secondary | ICD-10-CM | POA: Insufficient documentation

## 2016-03-09 NOTE — Progress Notes (Signed)
NOTE: This forensic urine drug screen (UDS) test was conducted using a state-of-the-art ultra high performance liquid chromatography and mass spectrometry system (UPLC/MS-MS), the most sophisticated and accurate method available. UPLC/MS-MS is 1,000 times more precise and accurate than standard gas chromatography and mass spectrometry (GC/MS). This system can analyze 26 drug categories and 180 drug compounds.  Unreported substance: Unreported Methadone detected.  The findings of this UDT were reported as abnormal due to inconsistencies with expected results. An unreported substance was identified in the sample. Expectations were based on the medication history provided by the patient at the time of sample collection.  These results may suggest one of the following possibilities:  1). The use of multiple providers, suggesting the illegal practice of "Doctor Shopping", in violation of [place] Statutes, as well as our medication agreement.  2). The use of unsanctioned and possibly illegal substances, in violation of [place] Statutes, as well as our medication agreement.

## 2016-03-19 ENCOUNTER — Ambulatory Visit: Payer: Medicare HMO | Admitting: Pain Medicine

## 2016-03-26 ENCOUNTER — Other Ambulatory Visit: Payer: Self-pay | Admitting: Pain Medicine

## 2016-03-26 DIAGNOSIS — M792 Neuralgia and neuritis, unspecified: Secondary | ICD-10-CM

## 2016-06-02 ENCOUNTER — Other Ambulatory Visit: Payer: Self-pay | Admitting: Pain Medicine

## 2016-06-02 DIAGNOSIS — M1000 Idiopathic gout, unspecified site: Secondary | ICD-10-CM

## 2016-06-12 ENCOUNTER — Ambulatory Visit: Payer: Medicare HMO | Admitting: Pain Medicine

## 2018-11-10 DEATH — deceased
